# Patient Record
Sex: Male | Born: 1965 | Race: Black or African American | Hispanic: No | Marital: Married
Health system: Southern US, Community
[De-identification: ages and names within clinical notes are randomized; demographics above are authoritative.]

---

## 2019-01-04 ENCOUNTER — Emergency Department (HOSPITAL_COMMUNITY)
Admission: EM | Admit: 2019-01-04 | Discharge: 2019-01-04 | Disposition: A | Discharge status: Home / Self Care | Payer: Self-pay | Attending: Emergency Medicine | Admitting: Emergency Medicine

## 2019-01-04 ENCOUNTER — Other Ambulatory Visit: Payer: Self-pay

## 2019-01-04 ENCOUNTER — Encounter (HOSPITAL_COMMUNITY): Payer: Self-pay

## 2019-01-04 ENCOUNTER — Emergency Department (HOSPITAL_COMMUNITY): Payer: PRIVATE HEALTH INSURANCE

## 2019-01-04 DIAGNOSIS — R002 Palpitations: Secondary | ICD-10-CM | POA: Diagnosis not present

## 2019-01-04 DIAGNOSIS — R000 Tachycardia, unspecified: Secondary | ICD-10-CM | POA: Diagnosis present

## 2019-01-04 DIAGNOSIS — R202 Paresthesia of skin: Secondary | ICD-10-CM

## 2019-01-04 LAB — CBC WITH DIFFERENTIAL/PLATELET
Abs Immature Granulocytes: 0.01 10*3/uL (ref 0.00–0.07)
Basophils Absolute: 0 10*3/uL (ref 0.0–0.1)
Basophils Relative: 1 %
Eosinophils Absolute: 0.1 10*3/uL (ref 0.0–0.5)
Eosinophils Relative: 1 %
HCT: 41.3 % (ref 39.0–52.0)
Hemoglobin: 13.6 g/dL (ref 13.0–17.0)
Immature Granulocytes: 0 %
Lymphocytes Relative: 42 %
Lymphs Abs: 2.7 10*3/uL (ref 0.7–4.0)
MCH: 31.3 pg (ref 26.0–34.0)
MCHC: 32.9 g/dL (ref 30.0–36.0)
MCV: 95.2 fL (ref 80.0–100.0)
Monocytes Absolute: 0.7 10*3/uL (ref 0.1–1.0)
Monocytes Relative: 11 %
Neutro Abs: 2.8 10*3/uL (ref 1.7–7.7)
Neutrophils Relative %: 45 %
Platelets: 309 10*3/uL (ref 150–400)
RBC: 4.34 MIL/uL (ref 4.22–5.81)
RDW: 13.6 % (ref 11.5–15.5)
WBC: 6.3 10*3/uL (ref 4.0–10.5)
nRBC: 0 % (ref 0.0–0.2)

## 2019-01-04 LAB — BASIC METABOLIC PANEL
Anion gap: 7 (ref 5–15)
BUN: 16 mg/dL (ref 6–20)
CO2: 27 mmol/L (ref 22–32)
Calcium: 9 mg/dL (ref 8.9–10.3)
Chloride: 106 mmol/L (ref 98–111)
Creatinine, Ser: 0.83 mg/dL (ref 0.61–1.24)
GFR calc Af Amer: >60 mL/min (ref >60)
GFR calc non Af Amer: >60 mL/min (ref >60)
Glucose, Bld: 94 mg/dL (ref 70–99)
Potassium: 4.6 mmol/L (ref 3.5–5.1)
Sodium: 140 mmol/L (ref 135–145)

## 2019-01-04 LAB — TROPONIN I (HIGH SENSITIVITY)
Troponin I (High Sensitivity): 3 ng/L (ref <18)
Troponin I (High Sensitivity): 3 ng/L (ref <18)

## 2019-01-04 NOTE — ED Provider Notes (Signed)
[facility]-EMERGENCY DEPT Provider Note   CSN: [id number] Arrival date & time: 01/04/19  0143    History   Chief Complaint Chief Complaint  Patient presents with  . Numbness    HPI [place] ScrapeJohnnie Adair Laundrygustin Roger is a 53 y.o. male.   The history is provided by the patient.  He woke up with a sense that his heart was racing and his left arm was numb.  Symptoms lasted about 10 minutes before resolving.  He denies chest pain, heaviness, tightness, pressure.  He denies nausea, vomiting, diaphoresis, dyspnea.  He has never had similar symptoms before.  He is a non-smoker and denies history of hypertension or diabetes or hyperlipidemia.  There is no family history of premature coronary atherosclerosis.  History reviewed. No pertinent past medical history.  There are no active problems to display for this patient.   ** The histories are not reviewed yet. Please review them in the "History" navigator section and refresh this SmartLink.      Home Medications    Prior to Admission medications   Not on File    Family History History reviewed. No pertinent family history.  Social History Social History   Tobacco Use  . Smoking status: Not on file  Substance Use Topics  . Alcohol use: Not on file  . Drug use: Not on file     Allergies   Patient has no known allergies.   Review of Systems Review of Systems  All other systems reviewed and are negative.    Physical Exam Updated Vital Signs BP 118/79 (BP Location: Right Arm)   Pulse 83   Temp 98.2 F (36.8 C) (Oral)   Resp 16   Ht 5' 9.5" (1.765 m)   Wt 65.3 kg   SpO2 100%   BMI 20.96 kg/m   Physical Exam Vitals signs and nursing note reviewed.    53 year old male, resting comfortably and in no acute distress. Vital signs are normal. Oxygen saturation is 100%, which is normal. Head is normocephalic and atraumatic. PERRLA, EOMI. Oropharynx is clear. Neck is nontender and supple without  adenopathy or JVD. Back is nontender and there is no CVA tenderness. Lungs are clear without rales, wheezes, or rhonchi. Chest is nontender. Heart has regular rate and rhythm without murmur. Abdomen is soft, flat, nontender without masses or hepatosplenomegaly and peristalsis is normoactive. Extremities have no cyanosis or edema, full range of motion is present. Skin is warm and dry without rash. Neurologic: Mental status is normal, cranial nerves are intact, there are no motor or sensory deficits.  ED Treatments / Results  Labs (all labs ordered are listed, but only abnormal results are displayed) Labs Reviewed  BASIC METABOLIC PANEL  CBC WITH DIFFERENTIAL/PLATELET  TROPONIN I (HIGH SENSITIVITY)  TROPONIN I (HIGH SENSITIVITY)    EKG EKG Interpretation  Date/Time:  Sunday January 04 2019 01:55:26 EDT Ventricular Rate:  77 PR Interval:    QRS Duration: 76 QT Interval:  365 QTC Calculation: 413 R Axis:   76 Text Interpretation:  Sinus rhythm Anteroseptal infarct, age indeterminate Early repolarization Baseline wander in lead(s) III No old tracing to compare Confirmed by Dione BoozeGlick, Dajanay Northrup ([id number]) on 01/04/2019 2:06:50 AM   Radiology Dg Chest 2 View  Result Date: 01/04/2019 CLINICAL DATA:  Chest pain EXAM: CHEST - 2 VIEW COMPARISON:  None. FINDINGS: The heart size and mediastinal contours are within normal limits. Both lungs are clear. The visualized skeletal structures are unremarkable. IMPRESSION: No active cardiopulmonary disease.  Electronically Signed   By: Constance Holster M.D.   On: 01/04/2019 03:50    Procedures Procedures  Medications Ordered in ED Medications - No data to display   Initial Impression / Assessment and Plan / ED Course  I have reviewed the triage vital signs and the nursing notes.  Pertinent labs & imaging results that were available during my care of the patient were reviewed by me and considered in my medical decision making (see chart for details).   Palpitations with numbness in the left arm.  He has no risk factors for coronary artery disease.  ECG is unremarkable.  Heart score is 2, which puts him at low risk for major adverse cardiac events in the next 6 weeks.  Will check chest x-ray and troponin.  Old records are reviewed, and he has no relevant past visits.  Chest x-ray is unremarkable.  Troponin is negative x2.  He is felt to be safe for discharge.  He will be referred to cardiology for outpatient work-up.  Final Clinical Impressions(s) / ED Diagnoses   Final diagnoses:  Paresthesia  Palpitations    ED Discharge Orders    None       Delora Fuel, MD 97/41/63 [phone]

## 2019-01-04 NOTE — Discharge Instructions (Addendum)
Return if you have any problems.

## 2019-01-04 NOTE — ED Triage Notes (Signed)
Pt reports that he was asleep and felt like his heart was racing and then his L arm was numb. He states that his arm feels better now and has no complaints. He is concerned that he may have slept on it wrong.

## 2021-01-06 IMAGING — CR CHEST - 2 VIEW
2 series · 2 of 2 positions shown · non-contrast
Comparison: None.

CLINICAL DATA: Chest pain

EXAM:
CHEST - 2 VIEW

[w chest pa]
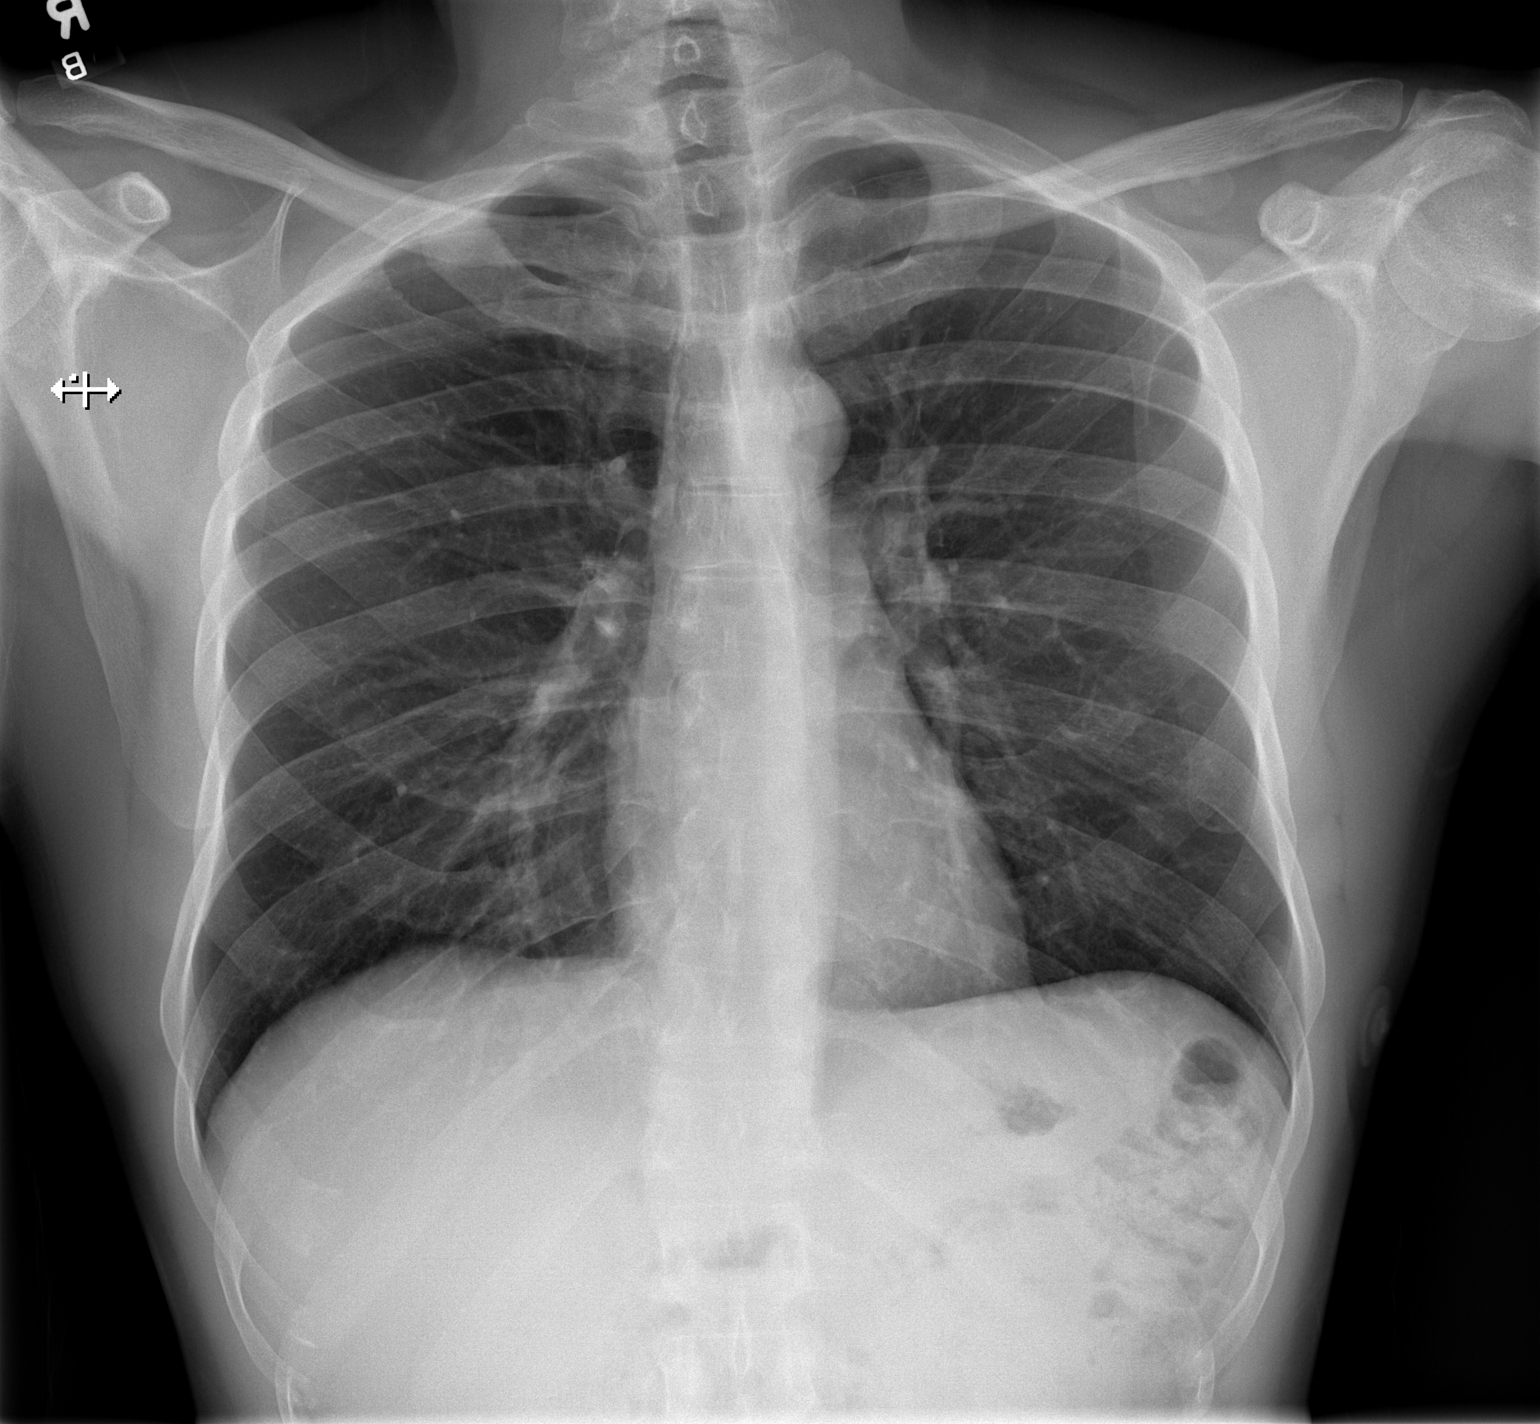

[w chest lat]
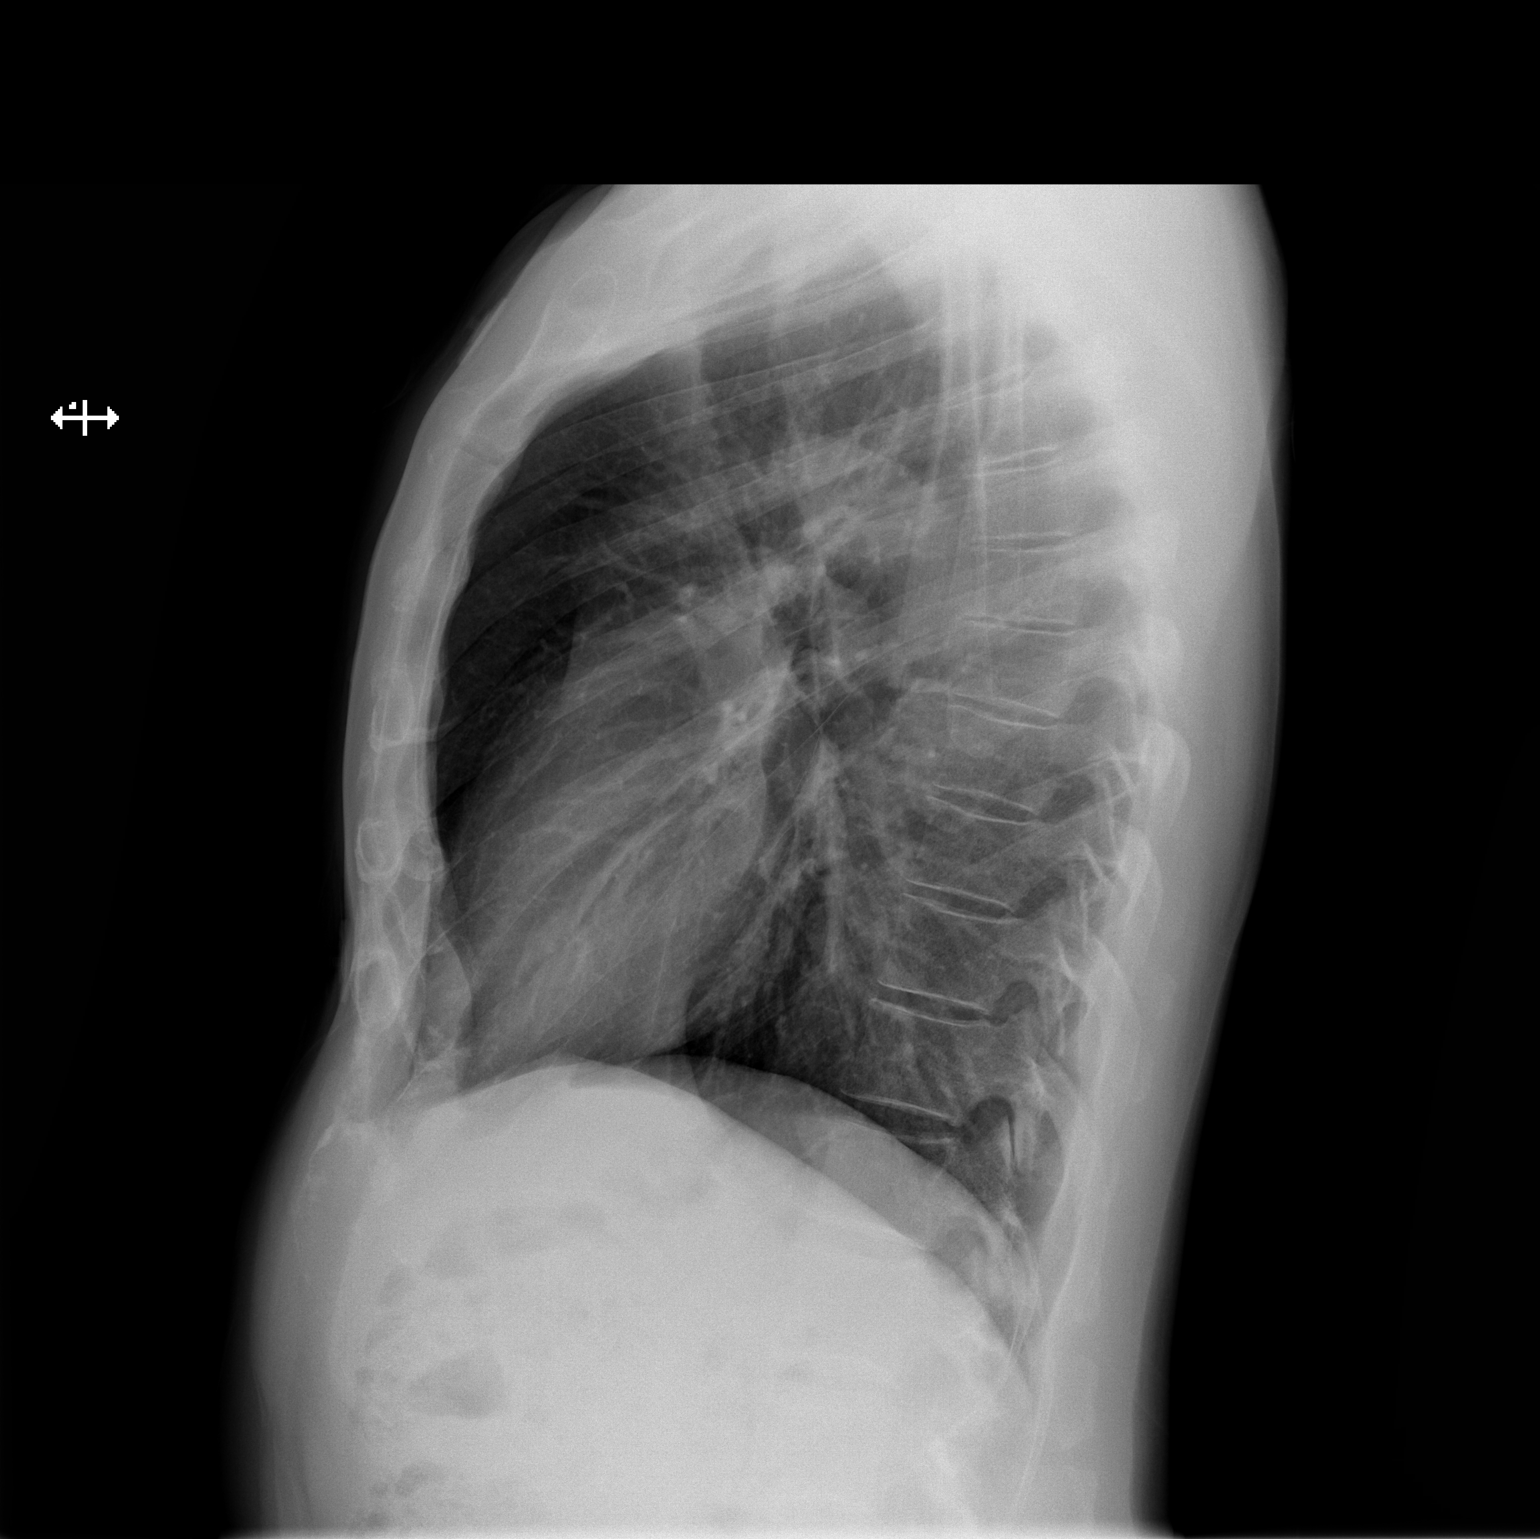

[2 of 2 positions shown; findings below may reference images not displayed]

FINDINGS: The heart size and mediastinal contours are within normal limits.
Both lungs are clear. The visualized skeletal structures are
unremarkable.
IMPRESSION: No active cardiopulmonary disease.
